# Patient Record
Sex: Male | Born: 1975 | Race: White | Hispanic: No | Marital: Married | Smoking: Current every day smoker
Health system: Southern US, Community
[De-identification: ages and names within clinical notes are randomized; demographics above are authoritative.]

## PROBLEM LIST (undated history)

## (undated) DIAGNOSIS — K219 Gastro-esophageal reflux disease without esophagitis: Secondary | ICD-10-CM

---

## 2007-11-16 ENCOUNTER — Emergency Department (HOSPITAL_COMMUNITY): Admission: EM | Admit: 2007-11-16 | Discharge: 2007-11-16 | Payer: Self-pay | Admitting: Emergency Medicine

## 2010-01-28 IMAGING — CR DG HAND COMPLETE 3+V*L*
4 series · 4 of 4 positions shown · non-contrast
Comparison: None

CLINICAL DATA: Left index finger injury with pain.

LEFT HAND - COMPLETE 3+ VIEW

[x hand ap left]
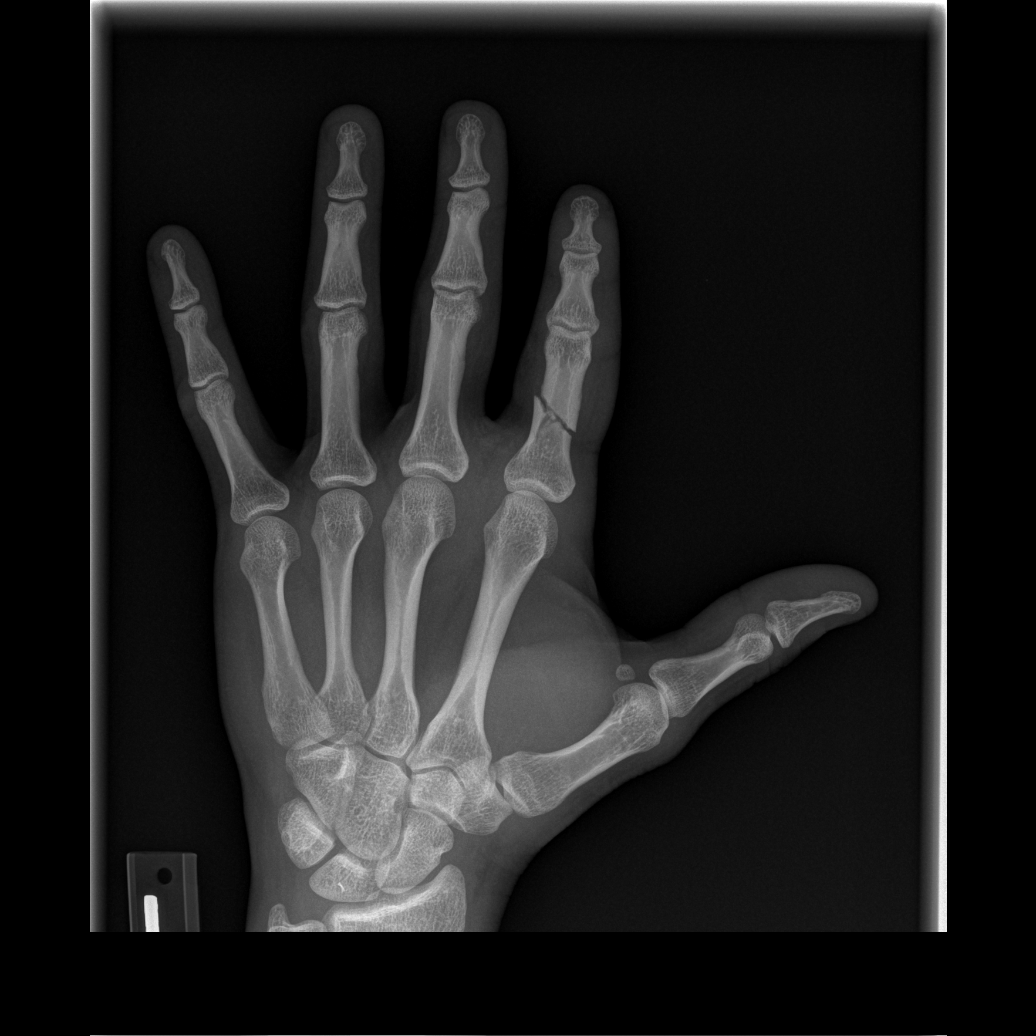

[x hand oblique left]
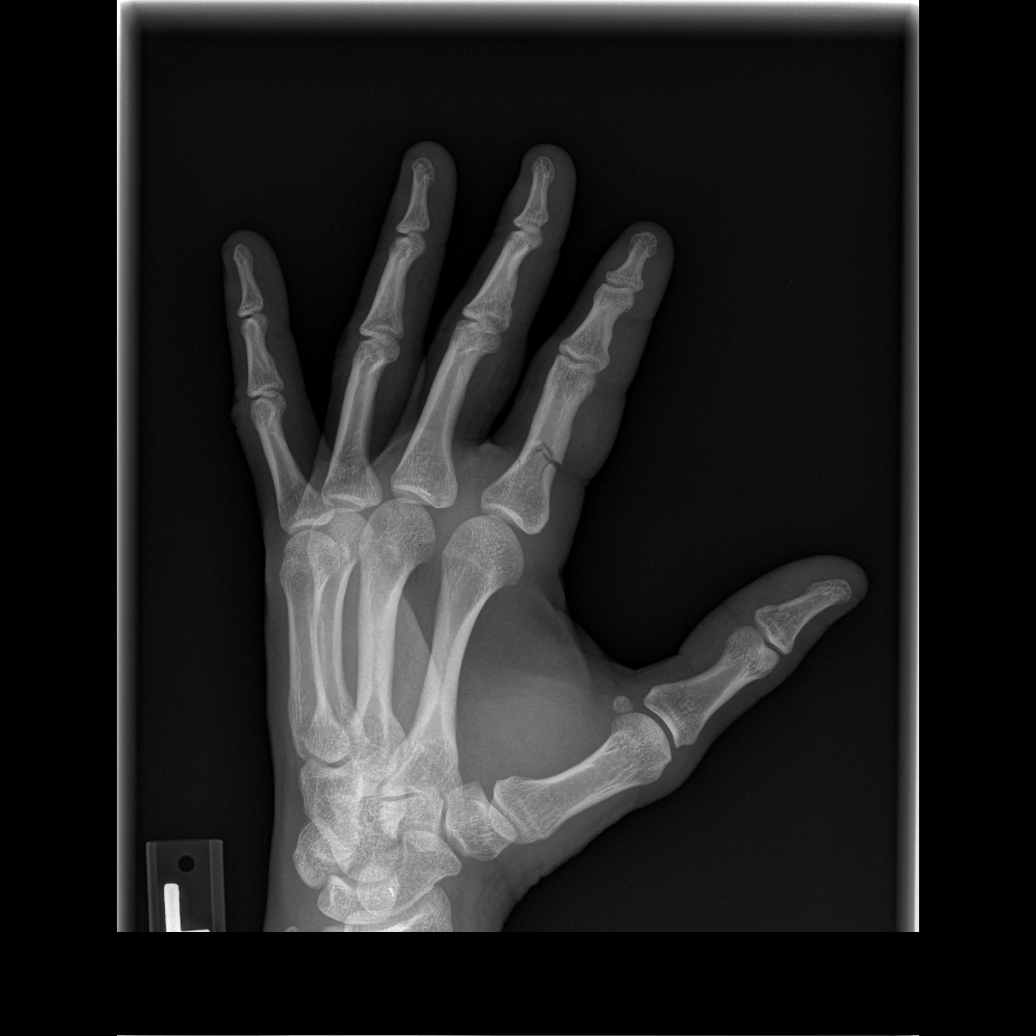

[x hand lat left (1 of 2)]
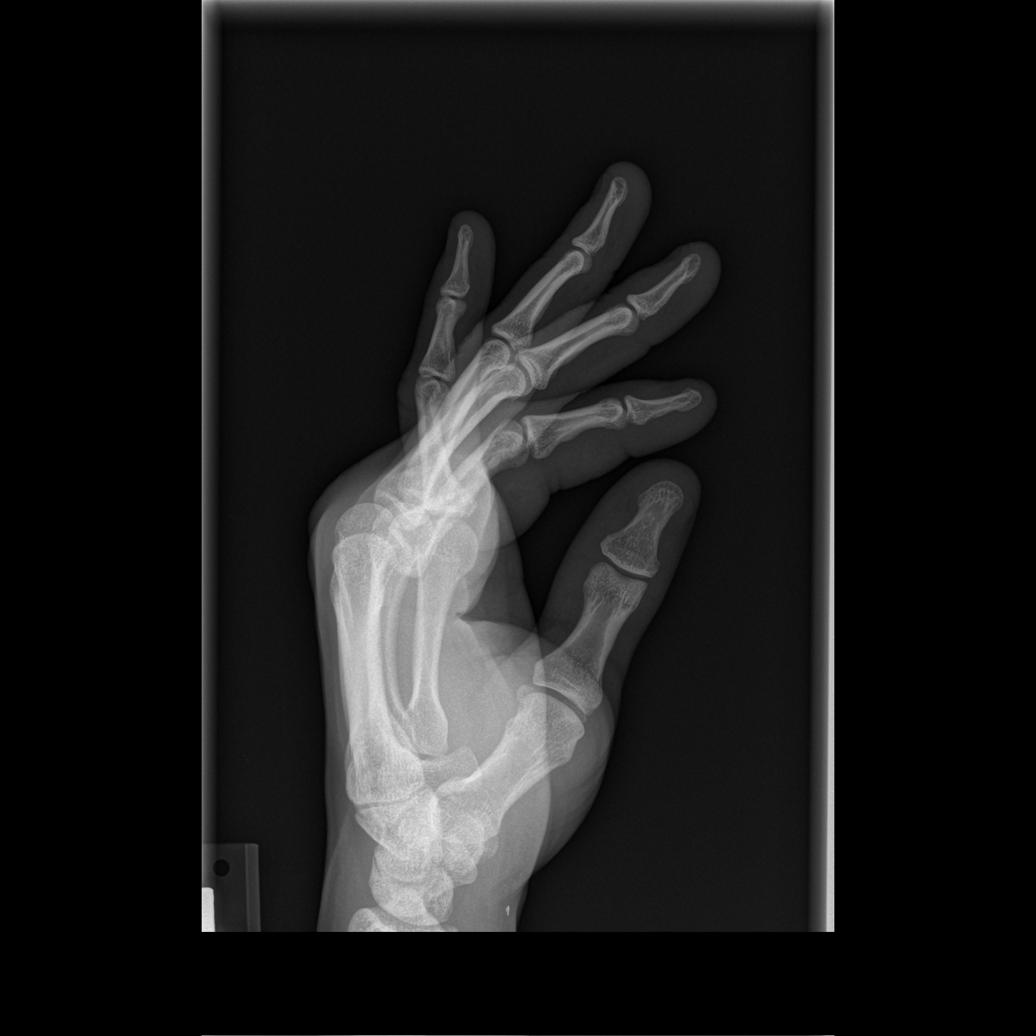

[x hand lat left (2 of 2)]
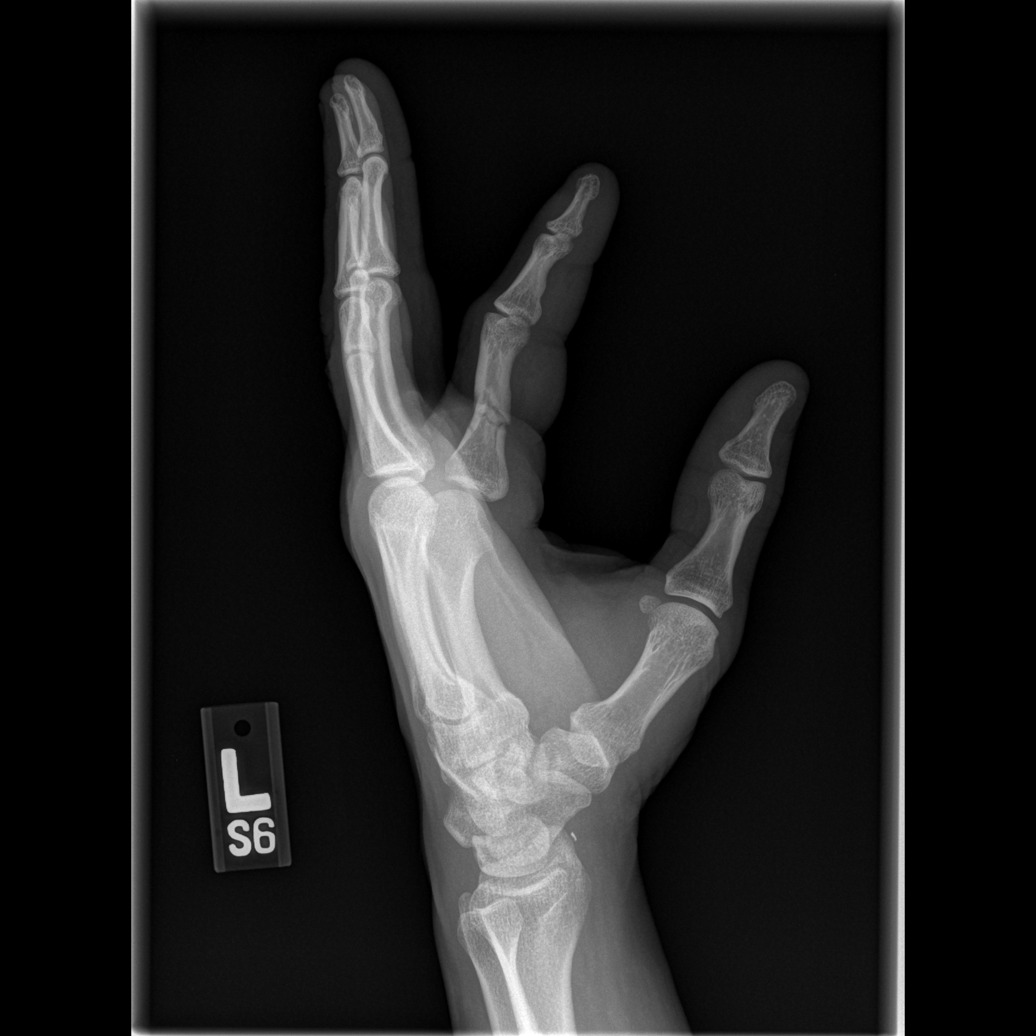

[4 of 4 positions shown; findings below may reference images not displayed]

FINDINGS: There is a minimally comminuted fracture of the mid
aspect of the proximal phalanx of the index finger with 1 mm
displacement. Normal alignment is noted.

There is no evidence of subluxation or dislocation.

A tiny metallic foreign body along the anterior soft tissues at the
level of the carpus may be remote but correlate clinically.
IMPRESSION: Fracture of the proximal phalanx of the index finger as described.

Tiny metallic foreign body along the anterior soft tissues at the
level of the carpus - correlate clinically.

## 2016-11-22 ENCOUNTER — Encounter (HOSPITAL_BASED_OUTPATIENT_CLINIC_OR_DEPARTMENT_OTHER): Payer: Self-pay | Admitting: Emergency Medicine

## 2016-11-22 ENCOUNTER — Emergency Department (HOSPITAL_BASED_OUTPATIENT_CLINIC_OR_DEPARTMENT_OTHER)
Admission: EM | Admit: 2016-11-22 | Discharge: 2016-11-22 | Disposition: A | Discharge status: Home / Self Care | Payer: 59 | Attending: Emergency Medicine | Admitting: Emergency Medicine

## 2016-11-22 DIAGNOSIS — Y9389 Activity, other specified: Secondary | ICD-10-CM | POA: Insufficient documentation

## 2016-11-22 DIAGNOSIS — Y929 Unspecified place or not applicable: Secondary | ICD-10-CM | POA: Diagnosis not present

## 2016-11-22 DIAGNOSIS — Y999 Unspecified external cause status: Secondary | ICD-10-CM | POA: Insufficient documentation

## 2016-11-22 DIAGNOSIS — W25XXXA Contact with sharp glass, initial encounter: Secondary | ICD-10-CM | POA: Insufficient documentation

## 2016-11-22 DIAGNOSIS — F17200 Nicotine dependence, unspecified, uncomplicated: Secondary | ICD-10-CM | POA: Diagnosis not present

## 2016-11-22 DIAGNOSIS — S61211A Laceration without foreign body of left index finger without damage to nail, initial encounter: Secondary | ICD-10-CM | POA: Insufficient documentation

## 2016-11-22 HISTORY — DX: Gastro-esophageal reflux disease without esophagitis: K21.9

## 2016-11-22 MED ORDER — LIDOCAINE HCL (PF) 1 % IJ SOLN
30.0000 mL | Freq: Once | INTRAMUSCULAR | Status: AC
  Administered 2016-11-22: 30 mL

## 2016-11-22 MED ORDER — LIDOCAINE HCL 1 % IJ SOLN
INTRAMUSCULAR | Status: DC
Start: 2016-11-22 — End: 2016-11-23
  Filled 2016-11-22: qty 20

## 2016-11-22 NOTE — ED Notes (Signed)
Pt reports bleed through of dressing. More gauze/coban applied, and pt instructed to hold his hand upright. EDP notified of bleeding. No bleed through noted on dressing presently. Pt instructed to call if he continues to bleed.

## 2016-11-22 NOTE — ED Provider Notes (Addendum)
[facility] Note   CSN: [id number] Arrival date & time: 11/22/16  1910     History   Chief Complaint Chief Complaint  Patient presents with  . Laceration    HPI Larry Morgan is a 41 y.o. male presents to ED for evaluation of left index finger laceration sustained PTA. Was lifting a glass vase that broke and fell past his left hand, slicing his finger. States top of finger feels numb since incident. No anticoagulants.   HPI  Past Medical History:  Diagnosis Date  . Acid reflux     There are no active problems to display for this patient.   History reviewed. No pertinent surgical history.     Home Medications    Prior to Admission medications   Not on File    Family History No family history on file.  Social History Social History  Substance Use Topics  . Smoking status: Current Every Day Smoker  . Smokeless tobacco: Never Used  . Alcohol use 3.0 oz/week    5 Cans of beer per week     Comment: daily     Allergies   Amoxicillin   Review of Systems Review of Systems  Skin: Positive for wound.  Neurological: Positive for numbness.  Hematological: Does not bruise/bleed easily.     Physical Exam Updated Vital Signs BP (!) 138/98 (BP Location: Right Arm)   Pulse 72   Resp 18   SpO2 99%   Physical Exam  Constitutional: He is oriented to person, place, and time. He appears well-developed and well-nourished. No distress.  NAD.  HENT:  Head: Normocephalic and atraumatic.  Right Ear: External ear normal.  Left Ear: External ear normal.  Nose: Nose normal.  Eyes: Conjunctivae and EOM are normal. No scleral icterus.  Neck: Normal range of motion. Neck supple.  Cardiovascular: Normal rate, regular rhythm, normal heart sounds and intact distal pulses.   No murmur heard. <2 cap refill to distal left index finger tip   Pulmonary/Chest: Effort normal and breath sounds normal. He has no wheezes.  Musculoskeletal: Normal range of  motion. He exhibits no deformity.  No focal tenderness to left index finger MCP, DIP and PIP Full AROM of left index finger including flexion and extension  Neurological: He is alert and oriented to person, place, and time.  Sensation to light touch to left index finger tip intact  Skin: Skin is warm and dry. Capillary refill takes less than 2 seconds. Laceration noted.  Left index laceration measuring 3 cm   Psychiatric: He has a normal mood and affect. His behavior is normal. Judgment and thought content normal.  Nursing note and vitals reviewed.    ED Treatments / Results  Labs (all labs ordered are listed, but only abnormal results are displayed) Labs Reviewed - No data to display  EKG  EKG Interpretation None       Radiology No results found.  Procedures .Marland KitchenLaceration Repair Date/Time: 12/08/2016 6:17 AM Performed by: Liberty Handy Authorized by: Liberty Handy   Consent:    Consent obtained:  Verbal   Consent given by:  Patient   Risks discussed:  Infection, pain, poor cosmetic result, retained foreign body, tendon damage and nerve damage   Alternatives discussed:  Referral Anesthesia (see MAR for exact dosages):    Anesthesia method:  Local infiltration   Local anesthetic:  Lidocaine 1% w/o epi Laceration details:    Location:  Finger   Finger location:  L index finger  Length (cm):  3 Repair type:    Repair type:  Complex Pre-procedure details:    Preparation:  Patient was prepped and draped in usual sterile fashion Exploration:    Limited defect created (wound extended): no     Hemostasis achieved with:  Epinephrine and direct pressure   Wound exploration: wound explored through full range of motion and entire depth of wound probed and visualized     Wound extent: vascular damage     Wound extent: no foreign bodies/material noted, no nerve damage noted, no tendon damage noted and no underlying fracture noted     Contaminated: no   Treatment:     Area cleansed with:  Betadine and saline   Amount of cleaning:  Extensive   Irrigation solution:  Sterile water and sterile saline   Irrigation method:  Syringe and tap   Visualized foreign bodies/material removed: no     Debridement:  Minimal   Undermining:  None   Scar revision: no   Skin repair:    Repair method:  Sutures   Suture technique:  Simple interrupted   Number of sutures:  5 Approximation:    Approximation:  Close Post-procedure details:    Dressing:  Antibiotic ointment, non-adherent dressing and splint for protection   Patient tolerance of procedure:  Tolerated well, no immediate complications   (including critical care time)  Addendum 12/08/16  Medications Ordered in ED Medications  lidocaine (XYLOCAINE) 1 % (with pres) injection (not administered)  lidocaine (PF) (XYLOCAINE) 1 % injection 30 mL (30 mLs Infiltration Given 11/22/16 2009)     Initial Impression / Assessment and Plan / ED Course  I have reviewed the triage vital signs and the nursing notes.  Pertinent labs & imaging results that were available during my care of the patient were reviewed by me and considered in my medical decision making (see chart for details).     Patient is a 41 y.o. yo male that presents with laceration to left index finger. Tetanus UTD. Pressure irrigation performed. Bottom of the wound visualized with bleeding controled, no foreign bodies seen.  No tendon injury noted on exam. Pt able to feel light touch to tip of finger but states his finger feels numb. He is good cap refill to tip of finger. Full AROM of affected extremity. Laceration occurred < 12 hours prior to repair which was well tolerated. Pt has no co morbidities to effect normal wound healing. Discussed suture home care with pt and answered questions. Pt to follow up for wound check and suture removal in 8-10 days. Pt is hemodynamically stable w no complaints prior to dc.    Final Clinical Impressions(s) / ED Diagnoses    Final diagnoses:  Laceration of left index finger without foreign body without damage to nail, initial encounter    New Prescriptions New Prescriptions   No medications on file     Jerrell Mylar 11/22/16 2125    Alvira Monday, MD 11/26/16 1416    Liberty Handy, PA-C 12/08/16 [id number]    Alvira Monday, MD 12/08/16 1353

## 2016-11-22 NOTE — ED Notes (Signed)
Pt dressing bleeding through, gauze and coban reapplied. EDP made aware. Orders placed.

## 2016-11-22 NOTE — ED Triage Notes (Signed)
Pt broke a glass decoration in his had. Has large lac to L index finger. Finger is still bleeding, pressure dressing applied.

## 2016-11-22 NOTE — ED Notes (Signed)
EDP and EDPA at bedside. Pressure dressing applied.

## 2016-11-22 NOTE — Discharge Instructions (Signed)
You were seen in the ED for laceration to your left index finger.   This was irrigated, cleaned and repaired with 5 sutures. Sutures need to come out in 8-10 days. You reported some numbness to your finger.  Deep laceration, especially in fingers, can injure small nerves. It is possible you may have injured one of these nerves, and have numbness for the next few weeks or permanently.   Keep dry for the next 24-48 hours. After this, you may rinse with clean water and soap, apply a thin later of antibiotic ointment and cover with dressing. Wear splint to prevent accidental trauma and dehiscence of laceration. Take tylenol 1000 mg every 8 hours for associated pain and swelling.   Monitor for signs of infection including redness, swelling, pain, warmth, yellow discharge, fevers

## 2016-11-22 NOTE — ED Notes (Signed)
PMS intact before and after. Pt tolerated well. All questions answered. 

## 2023-06-09 DEATH — deceased
